# Patient Record
Sex: Female | Born: 1990 | Race: Black or African American | Hispanic: No | Marital: Single
Health system: Southern US, Community
[De-identification: ages and names within clinical notes are randomized; demographics above are authoritative.]

---

## 2018-10-29 ENCOUNTER — Other Ambulatory Visit: Payer: Self-pay | Admitting: Internal Medicine

## 2018-10-29 DIAGNOSIS — E049 Nontoxic goiter, unspecified: Secondary | ICD-10-CM

## 2018-11-03 ENCOUNTER — Ambulatory Visit: Payer: Medicaid Other

## 2018-11-04 ENCOUNTER — Ambulatory Visit: Payer: Medicaid Other

## 2018-11-10 ENCOUNTER — Ambulatory Visit: Payer: Medicaid Other

## 2018-11-11 ENCOUNTER — Ambulatory Visit
Admission: RE | Admit: 2018-11-11 | Discharge: 2018-11-11 | Disposition: A | Discharge status: Home / Self Care | Payer: Medicaid Other | Source: Ambulatory Visit | Attending: Internal Medicine | Admitting: Internal Medicine

## 2018-11-11 ENCOUNTER — Other Ambulatory Visit: Payer: Self-pay

## 2018-11-11 DIAGNOSIS — E049 Nontoxic goiter, unspecified: Secondary | ICD-10-CM | POA: Diagnosis present

## 2018-11-19 ENCOUNTER — Telehealth: Payer: Self-pay | Admitting: *Deleted

## 2018-11-19 DIAGNOSIS — Z20822 Contact with and (suspected) exposure to covid-19: Secondary | ICD-10-CM

## 2018-11-19 NOTE — Telephone Encounter (Signed)
Crystal called from the office of Dr. Casilda Carls to refer this patient for covid-19 testing. Pt notified and scheduled for tomorrow at the [facility] in [place].  Advised that this is a drive thru test site and to wear a mask, stay in the care with windows rolled up until ready for testing. Pt voiced understanding.

## 2018-11-20 ENCOUNTER — Other Ambulatory Visit: Payer: Medicaid Other

## 2018-11-20 DIAGNOSIS — Z20822 Contact with and (suspected) exposure to covid-19: Secondary | ICD-10-CM

## 2018-11-27 LAB — NOVEL CORONAVIRUS, NAA: SARS-CoV-2, NAA: NOT DETECTED

## 2020-06-13 IMAGING — US US THYROID
1 series · 14 of 25 positions shown · non-contrast
Comparison: None.

CLINICAL DATA: Goiter.

EXAM:
THYROID ULTRASOUND
TECHNIQUE: Ultrasound examination of the thyroid gland and adjacent soft
tissues was performed.

[Series 1: us thyroid · 14 of 41 slices shown]
[im 1/41]
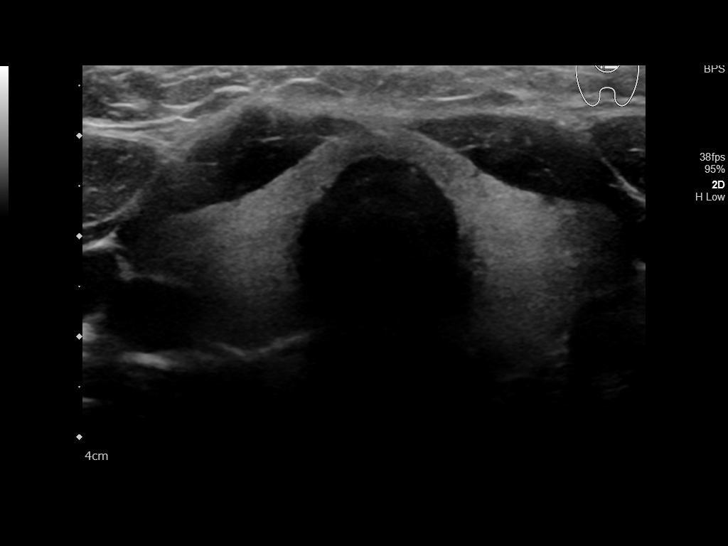
[im 4/41]
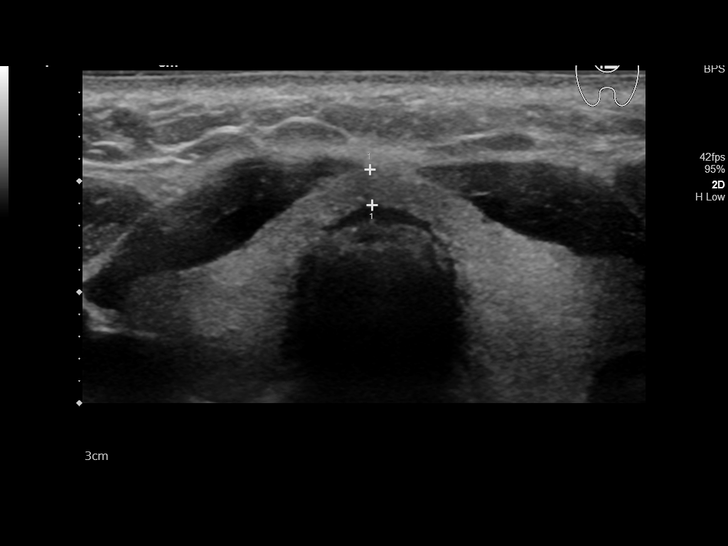
[im 7/41]
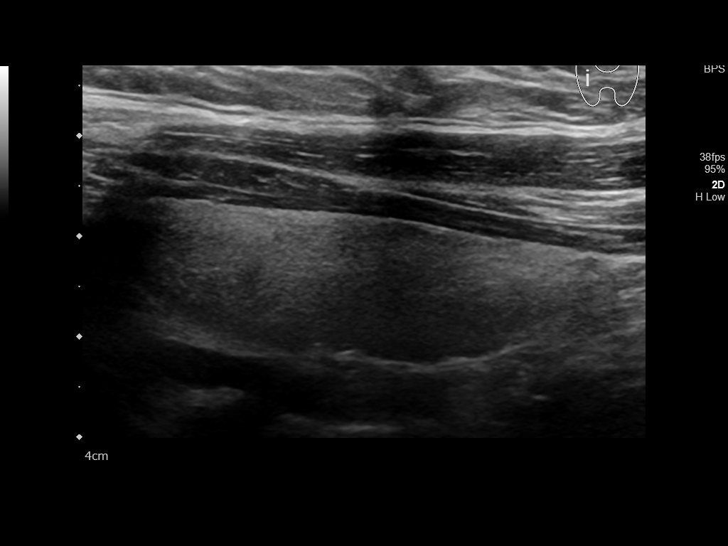
[im 11/41]
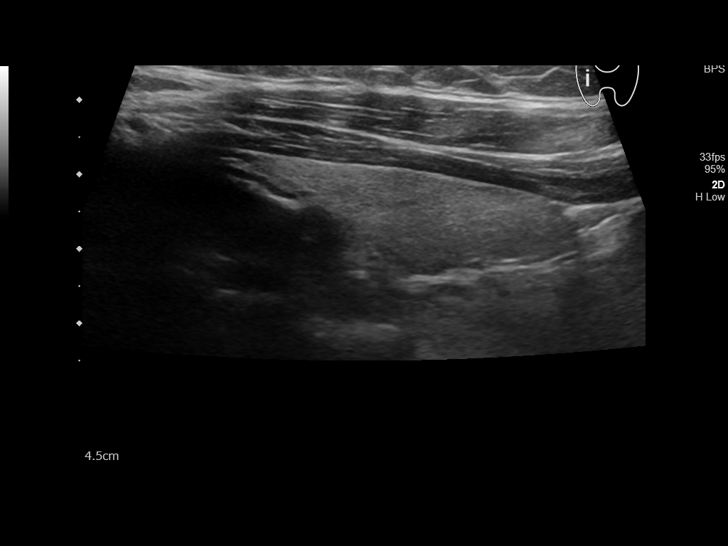
[im 14/41]
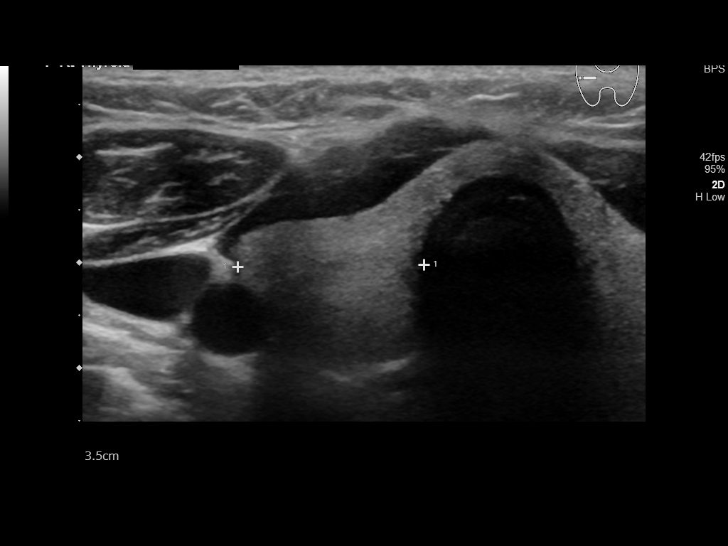
[im 16/41]
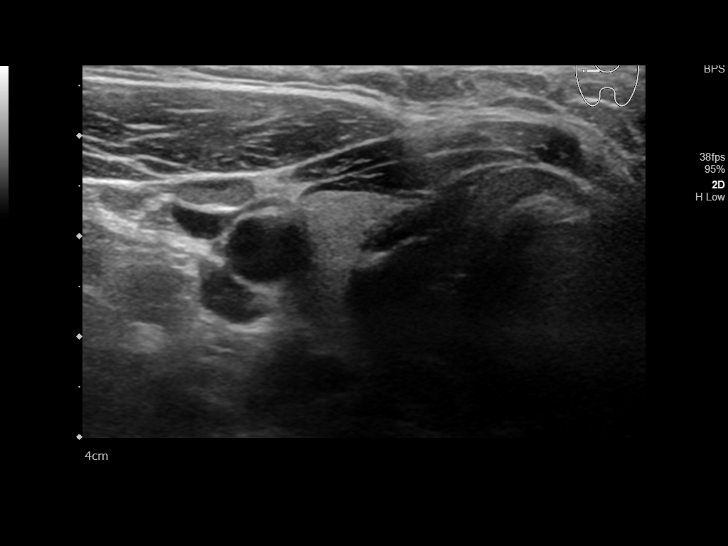
[im 19/41]
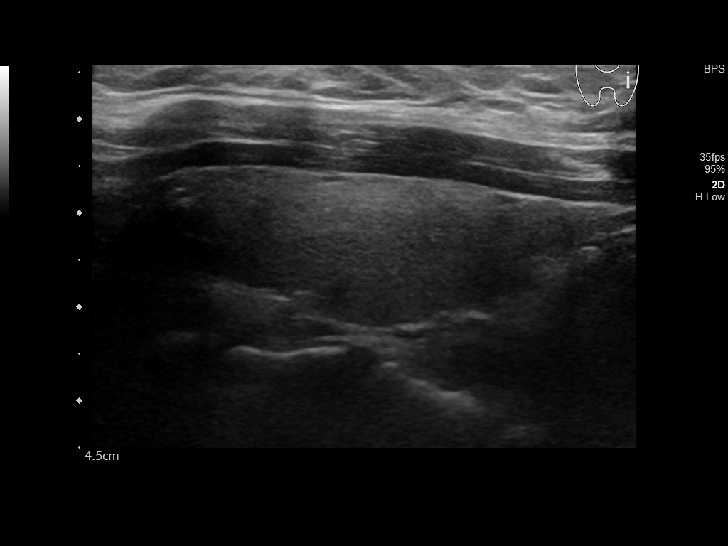
[im 22/41]
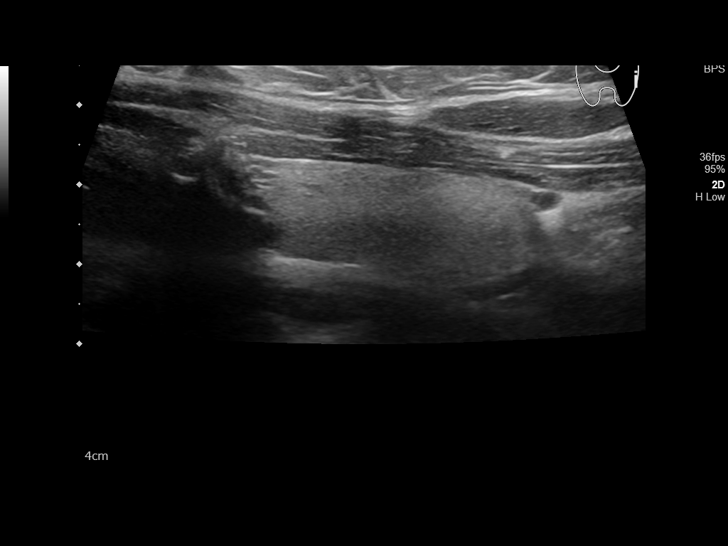
[im 26/41]
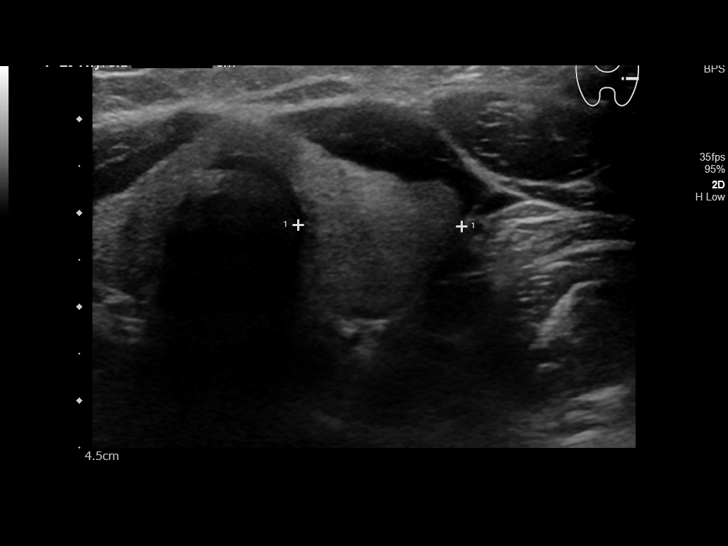
[im 27/41]
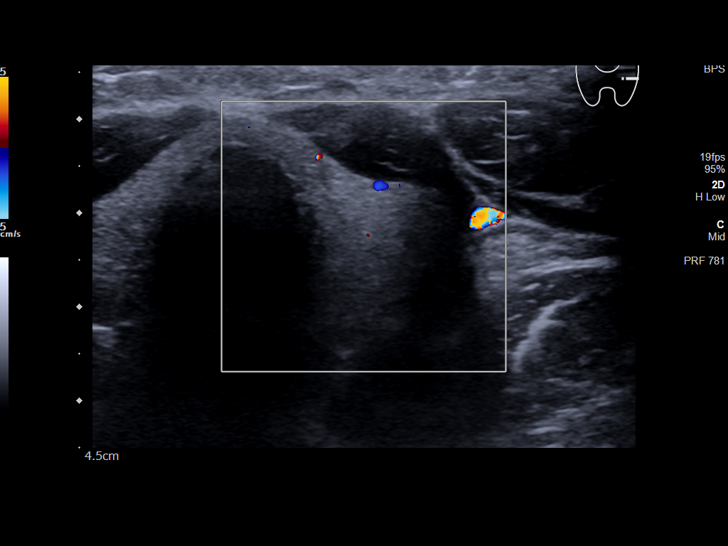
[im 31/41]
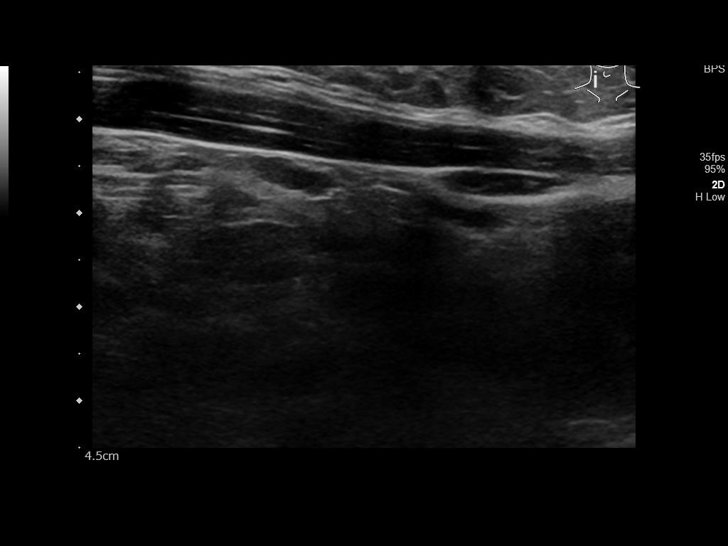
[im 34/41]
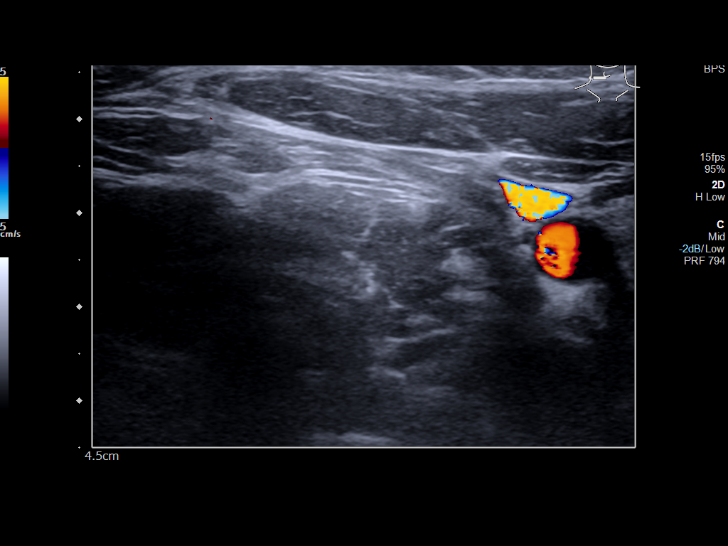
[im 37/41]
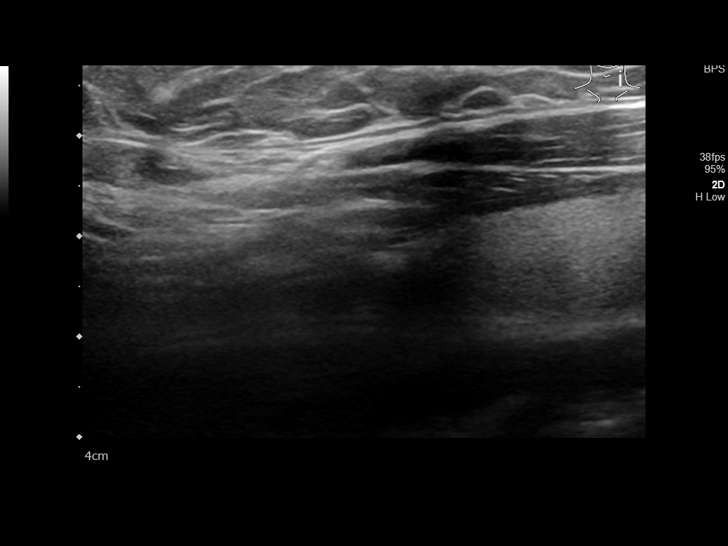
[im 41/41]
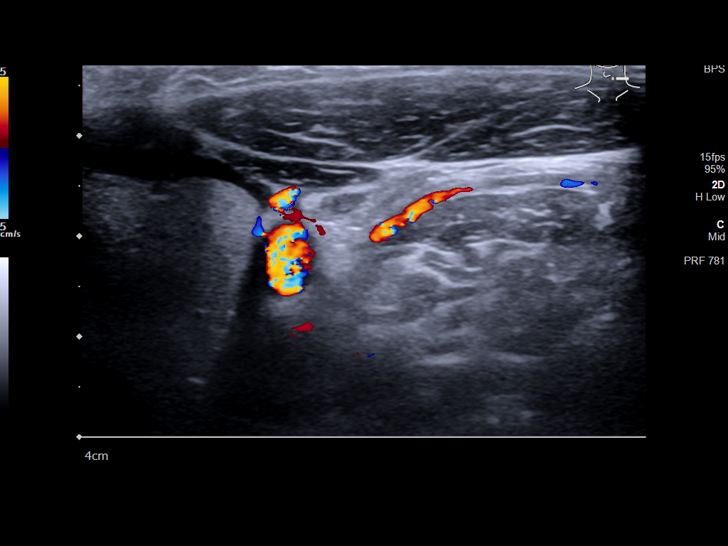

[14 of 25 positions shown; findings below may reference images not displayed]

FINDINGS: Parenchymal Echotexture: Normal

Isthmus: Normal in size measuring 0.3 cm in diameter

Right lobe: Normal in size measuring 4.6 x 1.3 x 1.8 cm

Left lobe: Normal in size measuring 4.8 x 1.7 x 1.7 cm

_________________________________________________________

Estimated total number of nodules >/= 1 cm: 0

Number of spongiform nodules >/=  2 cm not described below (TR1): 0

Number of mixed cystic and solid nodules >/= 1.5 cm not described
below (TR2): 0

_________________________________________________________

No discrete nodules are seen within the thyroid gland.
IMPRESSION: Normal thyroid ultrasound. Specifically, no evidence of thyromegaly
or discrete thyroid nodule mass.
